# Patient Record
Sex: Female | Born: 1950 | Race: White | Hispanic: No | Marital: Married | Smoking: Never smoker
Health system: Southern US, Community
[De-identification: ages and names within clinical notes are randomized; demographics above are authoritative.]

## PROBLEM LIST (undated history)

## (undated) DIAGNOSIS — N736 Female pelvic peritoneal adhesions (postinfective): Secondary | ICD-10-CM

## (undated) DIAGNOSIS — G2581 Restless legs syndrome: Secondary | ICD-10-CM

## (undated) DIAGNOSIS — M8580 Other specified disorders of bone density and structure, unspecified site: Secondary | ICD-10-CM

## (undated) DIAGNOSIS — N951 Menopausal and female climacteric states: Secondary | ICD-10-CM

## (undated) HISTORY — DX: Other specified disorders of bone density and structure, unspecified site: M85.80

## (undated) HISTORY — DX: Restless legs syndrome: G25.81

## (undated) HISTORY — DX: Female pelvic peritoneal adhesions (postinfective): N73.6

## (undated) HISTORY — DX: Menopausal and female climacteric states: N95.1

---

## 1980-03-17 HISTORY — PX: BREAST LUMPECTOMY: SHX2

## 1985-03-17 HISTORY — PX: PELVIC LAPAROSCOPY: SHX162

## 1998-02-05 ENCOUNTER — Other Ambulatory Visit: Admission: RE | Admit: 1998-02-05 | Discharge: 1998-02-05 | Payer: Self-pay | Admitting: Obstetrics and Gynecology

## 1999-02-06 ENCOUNTER — Other Ambulatory Visit: Admission: RE | Admit: 1999-02-06 | Discharge: 1999-02-06 | Payer: Self-pay | Admitting: Obstetrics and Gynecology

## 2000-02-12 ENCOUNTER — Other Ambulatory Visit: Admission: RE | Admit: 2000-02-12 | Discharge: 2000-02-12 | Payer: Self-pay | Admitting: Obstetrics and Gynecology

## 2001-04-05 ENCOUNTER — Other Ambulatory Visit: Admission: RE | Admit: 2001-04-05 | Discharge: 2001-04-05 | Payer: Self-pay | Admitting: Obstetrics and Gynecology

## 2002-04-05 ENCOUNTER — Other Ambulatory Visit: Admission: RE | Admit: 2002-04-05 | Discharge: 2002-04-05 | Payer: Self-pay | Admitting: Obstetrics and Gynecology

## 2003-06-22 ENCOUNTER — Encounter: Admission: RE | Admit: 2003-06-22 | Discharge: 2003-06-22 | Payer: Self-pay | Admitting: Obstetrics and Gynecology

## 2003-08-23 ENCOUNTER — Other Ambulatory Visit: Admission: RE | Admit: 2003-08-23 | Discharge: 2003-08-23 | Payer: Self-pay | Admitting: Obstetrics and Gynecology

## 2003-10-25 ENCOUNTER — Ambulatory Visit (HOSPITAL_COMMUNITY): Admission: RE | Admit: 2003-10-25 | Discharge: 2003-10-25 | Payer: Self-pay | Admitting: Gastroenterology

## 2004-10-30 ENCOUNTER — Other Ambulatory Visit: Admission: RE | Admit: 2004-10-30 | Discharge: 2004-10-30 | Payer: Self-pay | Admitting: Obstetrics and Gynecology

## 2005-11-03 ENCOUNTER — Other Ambulatory Visit: Admission: RE | Admit: 2005-11-03 | Discharge: 2005-11-03 | Payer: Self-pay | Admitting: Obstetrics and Gynecology

## 2006-03-17 HISTORY — PX: OTHER SURGICAL HISTORY: SHX169

## 2007-01-26 ENCOUNTER — Other Ambulatory Visit: Admission: RE | Admit: 2007-01-26 | Discharge: 2007-01-26 | Payer: Self-pay | Admitting: Obstetrics and Gynecology

## 2008-02-03 ENCOUNTER — Other Ambulatory Visit: Admission: RE | Admit: 2008-02-03 | Discharge: 2008-02-03 | Payer: Self-pay | Admitting: Obstetrics and Gynecology

## 2008-02-03 ENCOUNTER — Encounter: Payer: Self-pay | Admitting: Obstetrics and Gynecology

## 2008-02-03 ENCOUNTER — Ambulatory Visit: Payer: Self-pay | Admitting: Obstetrics and Gynecology

## 2008-06-14 ENCOUNTER — Encounter: Admission: RE | Admit: 2008-06-14 | Discharge: 2008-06-14 | Payer: Self-pay | Admitting: Internal Medicine

## 2009-02-12 ENCOUNTER — Ambulatory Visit: Payer: Self-pay | Admitting: Obstetrics and Gynecology

## 2009-02-12 ENCOUNTER — Other Ambulatory Visit: Admission: RE | Admit: 2009-02-12 | Discharge: 2009-02-12 | Payer: Self-pay | Admitting: Obstetrics and Gynecology

## 2009-11-30 ENCOUNTER — Encounter: Admission: RE | Admit: 2009-11-30 | Discharge: 2009-11-30 | Payer: Self-pay | Admitting: Internal Medicine

## 2010-02-21 ENCOUNTER — Ambulatory Visit: Payer: Self-pay | Admitting: Obstetrics and Gynecology

## 2010-02-21 ENCOUNTER — Other Ambulatory Visit
Admission: RE | Admit: 2010-02-21 | Discharge: 2010-02-21 | Payer: Self-pay | Source: Home / Self Care | Admitting: Obstetrics and Gynecology

## 2010-04-17 DIAGNOSIS — Z1211 Encounter for screening for malignant neoplasm of colon: Secondary | ICD-10-CM

## 2010-07-19 ENCOUNTER — Other Ambulatory Visit (INDEPENDENT_AMBULATORY_CARE_PROVIDER_SITE_OTHER): Payer: BC Managed Care – PPO

## 2010-07-19 DIAGNOSIS — E7800 Pure hypercholesterolemia, unspecified: Secondary | ICD-10-CM

## 2010-08-02 NOTE — Op Note (Signed)
NAME:  [name], [name]                     ACCOUNT NO.:  [id number]   MEDICAL RECORD NO.:  [id number]                   PATIENT TYPE:  AMB   LOCATION:  ENDO                                 FACILITY:  [facility]   PHYSICIAN:  Danise Edge, M.D.                DATE OF BIRTH:  1950/05/15   DATE OF PROCEDURE:  10/25/2003  DATE OF DISCHARGE:                                 OPERATIVE REPORT   PROCEDURE:  Screening colonoscopy.   PROCEDURE INDICATION:  Ms. Victoria Ramos is a 60 year old female born  [birth year].  Ms. Victoria Ramos is scheduled to undergo her first  screening colonoscopy with polypectomy to prevent colon cancer.   ENDOSCOPIST:  Danise Edge, M.D.   PREMEDICATION:  Versed 7.5 mg, Demerol 75 mg.   PROCEDURE:  After obtaining informed consent, Ms. Victoria Ramos was placed in the  left lateral decubitus position.  I administered intravenous Demerol and  intravenous Versed to achieve conscious sedation for the procedure.  The  patient's blood pressure, oxygen saturation, and cardiac rhythm were  monitored throughout the procedure and documented in the medical record.   Anal inspection and digital rectal exam were normal.  The Olympus adjustable  pediatric colonoscope was introduced into the rectum and advanced to the  cecum.  Colonic preparation for the exam today was satisfactory.   Rectum normal.   Sigmoid colon and descending colon normal.   Splenic flexure normal.   Transverse colon normal.   Hepatic flexure normal.   Ascending colon normal.   Cecum and ileocecal valve normal.   ASSESSMENT:  Normal screening proctocolonoscopy to the cecum.  No endoscopic  evidence for the presence of colorectal neoplasia.                                               Danise Edge, M.D.    MJ/MEDQ  D:  10/25/2003  T:  10/25/2003  Job:  [id number]   cc:   Theressa Millard, M.D.  [address] [place]  [place] [address]  Fax: [phone]   Rande Brunt. Eda Paschal, M.D.  [address], [address]  [place]  [place] [address]  Fax: [phone]

## 2010-10-16 ENCOUNTER — Other Ambulatory Visit: Payer: Self-pay | Admitting: *Deleted

## 2010-10-16 MED ORDER — ERGOCALCIFEROL 1.25 MG (50000 UT) PO CAPS: ORAL_CAPSULE | ORAL | Status: DC

## 2011-03-19 DIAGNOSIS — N736 Female pelvic peritoneal adhesions (postinfective): Secondary | ICD-10-CM | POA: Insufficient documentation

## 2011-03-19 DIAGNOSIS — N951 Menopausal and female climacteric states: Secondary | ICD-10-CM | POA: Insufficient documentation

## 2011-03-24 ENCOUNTER — Encounter: Payer: BC Managed Care – PPO | Admitting: Obstetrics and Gynecology

## 2011-03-27 ENCOUNTER — Encounter: Payer: BC Managed Care – PPO | Admitting: Obstetrics and Gynecology

## 2011-04-07 ENCOUNTER — Other Ambulatory Visit (HOSPITAL_COMMUNITY)
Admission: RE | Admit: 2011-04-07 | Discharge: 2011-04-07 | Disposition: A | Discharge status: Home / Self Care | Payer: BC Managed Care – PPO | Source: Ambulatory Visit | Attending: Obstetrics and Gynecology | Admitting: Obstetrics and Gynecology

## 2011-04-07 ENCOUNTER — Encounter: Payer: Self-pay | Admitting: Obstetrics and Gynecology

## 2011-04-07 ENCOUNTER — Ambulatory Visit (INDEPENDENT_AMBULATORY_CARE_PROVIDER_SITE_OTHER): Payer: BC Managed Care – PPO | Admitting: Obstetrics and Gynecology

## 2011-04-07 VITALS — BP 122/76 | Ht 66.0 in | Wt 152.0 lb

## 2011-04-07 DIAGNOSIS — Z01419 Encounter for gynecological examination (general) (routine) without abnormal findings: Secondary | ICD-10-CM

## 2011-04-07 DIAGNOSIS — Z833 Family history of diabetes mellitus: Secondary | ICD-10-CM

## 2011-04-07 DIAGNOSIS — R635 Abnormal weight gain: Secondary | ICD-10-CM

## 2011-04-07 LAB — URINALYSIS W MICROSCOPIC + REFLEX CULTURE
Bilirubin Urine: NEGATIVE
Glucose, UA: NEGATIVE mg/dL
Ketones, ur: NEGATIVE mg/dL
Leukocytes, UA: NEGATIVE
Nitrite: NEGATIVE
Protein, ur: NEGATIVE mg/dL
Urobilinogen, UA: 0.2 mg/dL (ref 0.0–1.0)

## 2011-04-07 LAB — CBC WITH DIFFERENTIAL/PLATELET
Basophils Absolute: 0 10*3/uL (ref 0.0–0.1)
Eosinophils Relative: 2 % (ref 0–5)
Lymphocytes Relative: 45 % (ref 12–46)
MCHC: 33.1 g/dL (ref 30.0–36.0)
Monocytes Absolute: 0.3 10*3/uL (ref 0.1–1.0)
Monocytes Relative: 7 % (ref 3–12)
Neutro Abs: 1.6 10*3/uL — ABNORMAL LOW (ref 1.7–7.7)
Neutrophils Relative %: 44 % (ref 43–77)
RDW: 12.4 % (ref 11.5–15.5)
WBC: 3.6 10*3/uL — ABNORMAL LOW (ref 4.0–10.5)

## 2011-04-07 LAB — LIPID PANEL
HDL: 72 mg/dL (ref >39)
VLDL: 20 mg/dL (ref 0–40)

## 2011-04-07 MED ORDER — ESTRADIOL-LEVONORGESTREL 0.045-0.015 MG/DAY TD PTWK: 1.0000 | MEDICATED_PATCH | TRANSDERMAL | Status: DC

## 2011-04-07 NOTE — Progress Notes (Signed)
Patient came to see me today for her annual GYN exam. She is having a lot of hot flashes. She took Climara pro for 4 years. She stopped it and has been using over-the-counter medicine for approximately 2 years. She remains symptomatic. She is going to schedule her yearly mammogram. She has low bone mass without  an elevated FRAX risk. She is taking calcium and vitamin D. She's had no fractures. She is having no vaginal bleeding or pelvic pain. She has been noticing a lot of weight gain.  Physical examination:  Kennon Portela present HEENT within normal limits. Neck: Thyroid not large. No masses. Supraclavicular nodes: not enlarged. Breasts: Examined in both sitting midline position. No skin changes and no masses. Abdomen: Soft no guarding rebound or masses or hernia. Pelvic: External: Within normal limits. BUS: Within normal limits. Vaginal:within normal limits. Poor estrogen effect. No evidence of cystocele rectocele or enterocele. Cervix: clean. Uterus: Normal size and shape. Adnexa: No masses. Rectovaginal exam: Confirmatory and negative. Extremities: Within normal limits.  Assessment: #1. Menopausal symptoms.#2. Osteopenia#3. Weight gain  Plan: Climara pro. She usually cuts patch and half with good results. She will continue to do so. Mammogram. Discussed higher risk of breast.  cancer with estrogen. Discussed weight gain with menopause. TSH checked. Increase exercise and watch diet.

## 2011-04-08 LAB — HEMOGLOBIN A1C
Hgb A1c MFr Bld: 5.8 % — ABNORMAL HIGH (ref <5.7)
Mean Plasma Glucose: 120 mg/dL — ABNORMAL HIGH (ref <117)

## 2011-07-16 ENCOUNTER — Ambulatory Visit (INDEPENDENT_AMBULATORY_CARE_PROVIDER_SITE_OTHER): Payer: BC Managed Care – PPO | Admitting: Obstetrics and Gynecology

## 2011-07-16 DIAGNOSIS — N898 Other specified noninflammatory disorders of vagina: Secondary | ICD-10-CM

## 2011-07-16 LAB — WET PREP FOR TRICH, YEAST, CLUE
Clue Cells Wet Prep HPF POC: NONE SEEN
Trich, Wet Prep: NONE SEEN
Yeast Wet Prep HPF POC: NONE SEEN

## 2011-07-16 MED ORDER — METRONIDAZOLE 0.75 % VA GEL: 1.0000 | Freq: Every day | VAGINAL | Status: AC

## 2011-07-16 NOTE — Progress Notes (Signed)
Patient came to see me today with a short history of a sticky vaginal discharge without itching or odor. She had been on sulfa antibiotics 2 months ago for an upper respiratory infection.  Exam: External within normal limits. BUS:  within normal limits. Vaginal exam: Frothy discharge. Wet prep positive for WBCs and bacteria. Otherwise negative.  Assessment: Bacterial vaginosis  Plan: MetroGel vaginal cream at bedtime for 5 nights.

## 2011-09-04 ENCOUNTER — Other Ambulatory Visit: Payer: Self-pay | Admitting: Obstetrics and Gynecology

## 2011-09-11 ENCOUNTER — Encounter: Payer: Self-pay | Admitting: Obstetrics and Gynecology

## 2011-09-22 ENCOUNTER — Other Ambulatory Visit: Payer: Self-pay | Admitting: *Deleted

## 2011-09-22 DIAGNOSIS — R928 Other abnormal and inconclusive findings on diagnostic imaging of breast: Secondary | ICD-10-CM

## 2011-09-26 ENCOUNTER — Emergency Department (HOSPITAL_COMMUNITY)
Admission: EM | Admit: 2011-09-26 | Discharge: 2011-09-26 | Disposition: A | Discharge status: Home / Self Care | Payer: BC Managed Care – PPO | Attending: Emergency Medicine | Admitting: Emergency Medicine

## 2011-09-26 ENCOUNTER — Encounter: Payer: Self-pay | Admitting: Obstetrics and Gynecology

## 2011-09-26 ENCOUNTER — Encounter (HOSPITAL_COMMUNITY): Payer: Self-pay | Admitting: Emergency Medicine

## 2011-09-26 DIAGNOSIS — T6391XA Toxic effect of contact with unspecified venomous animal, accidental (unintentional), initial encounter: Secondary | ICD-10-CM | POA: Insufficient documentation

## 2011-09-26 DIAGNOSIS — T63461A Toxic effect of venom of wasps, accidental (unintentional), initial encounter: Secondary | ICD-10-CM | POA: Insufficient documentation

## 2011-09-26 DIAGNOSIS — M810 Age-related osteoporosis without current pathological fracture: Secondary | ICD-10-CM | POA: Insufficient documentation

## 2011-09-26 DIAGNOSIS — T7840XA Allergy, unspecified, initial encounter: Secondary | ICD-10-CM

## 2011-09-26 MED ORDER — DEXAMETHASONE SODIUM PHOSPHATE 10 MG/ML IJ SOLN
10.0000 mg | Freq: Once | INTRAMUSCULAR | Status: AC
  Administered 2011-09-26: 10 mg via INTRAVENOUS
  Filled 2011-09-26: qty 1

## 2011-09-26 MED ORDER — SODIUM CHLORIDE 0.9 % IV SOLN
Freq: Once | INTRAVENOUS | Status: AC
  Administered 2011-09-26: 13:00:00 via INTRAVENOUS

## 2011-09-26 MED ORDER — EPINEPHRINE 0.3 MG/0.3ML IJ DEVI: 0.3000 mg | Freq: Once | INTRAMUSCULAR | Status: AC

## 2011-09-26 MED ORDER — FAMOTIDINE IN NACL 20-0.9 MG/50ML-% IV SOLN
20.0000 mg | Freq: Once | INTRAVENOUS | Status: AC
  Administered 2011-09-26: 20 mg via INTRAVENOUS
  Filled 2011-09-26: qty 50

## 2011-09-26 MED ORDER — DIPHENHYDRAMINE HCL 50 MG/ML IJ SOLN
25.0000 mg | Freq: Once | INTRAMUSCULAR | Status: AC
  Administered 2011-09-26: 25 mg via INTRAVENOUS
  Filled 2011-09-26: qty 1

## 2011-09-26 MED ORDER — PREDNISONE 10 MG PO TABS: 20.0000 mg | ORAL_TABLET | Freq: Every day | ORAL | Status: DC

## 2011-09-26 NOTE — ED Notes (Signed)
Bee sting to RT lateral forearm about an hour ago.  Has an allergy to them but didn't use anything.  She is starting to get hives to upper arms but is breathing fine.

## 2011-09-26 NOTE — ED Provider Notes (Signed)
History     CSN: [id number]  Arrival date & time 09/26/11  1255   First MD Initiated Contact with Patient 09/26/11 1303      Chief Complaint  Patient presents with  . Allergic Reaction  . Insect Bite  . Urticaria    (Consider location/radiation/quality/duration/timing/severity/associated sxs/prior treatment) HPI Comments: Patient presents today with a rash resulting after being stung by a bee about an hour ago. She has a bee sting to her right forearm. She certainly has developed a rash that started on her arms and now is going to her back. She says it is itchy. She denies any facial swelling or difficulty swallowing. Denies any swelling of her lips or tongue. She denies any difficulty breathing but feels a little tight across her chest. She denies any other chest pain. 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 She has EpiPen at home but has never used it. She did not take any medications prior to arrival  The history is provided by the patient.    Past Medical History  Diagnosis Date  . Pelvic adhesions   . Osteoporosis   . Menopausal symptoms     Past Surgical History  Procedure Date  . Pelvic laparoscopy 1987    DIAGNOSTIC LAPAROSCOPY  . Thumb surgery 2008  . Breast lumpectomy 1982    Benign    Family History  Problem Relation Age of Onset  . Hypertension Mother   . Diabetes Maternal Aunt   . Diabetes Maternal Aunt   . Cancer Father     Prostate cancer    History  Substance Use Topics  . Smoking status: Never Smoker   . Smokeless tobacco: Not on file  . Alcohol Use: 2.5 oz/week    5 drink(s) per week    OB History    Grav Para Term Preterm Abortions TAB SAB Ect Mult Living   1 0   1     0      Review of Systems  Constitutional: Negative for fever, chills, diaphoresis and fatigue.  HENT: Negative for congestion, rhinorrhea and sneezing.   Eyes: Negative.   Respiratory: Negative for cough, chest tightness and shortness of  breath.   Cardiovascular: Negative for chest pain and leg swelling.  Gastrointestinal: Negative for nausea, vomiting, abdominal pain, diarrhea and blood in stool.  Genitourinary: Negative for frequency, hematuria, flank pain and difficulty urinating.  Musculoskeletal: Negative for back pain and arthralgias.  Skin: Positive for rash.  Neurological: Negative for dizziness, speech difficulty, weakness, numbness and headaches.    Allergies  Review of patient's allergies indicates no known allergies.  Home Medications   Current Outpatient Rx  Name Route Sig Dispense Refill  . CALCIUM-MAGNESIUM-VITAMIN D PO Oral Take by mouth.      . ERGOCALCIFEROL 50000 UNITS PO CAPS Oral Take 50,000 Units by mouth once a week. Take 1 capsule once a week for 12 weeks then 1 capsule every other week.    Marland Kitchen ESTRADIOL-LEVONORGESTREL 0.045-0.015 MG/DAY TD PTWK Transdermal Place 1 patch onto the skin once a week. 4 patch 12  . HYDROCODONE-ACETAMINOPHEN 5-500 MG PO TABS Oral Take 1-2 tablets by mouth every 6 (six) hours as needed. For pain    . MELOXICAM 15 MG PO TABS Oral Take 15 mg by mouth daily.     Marland Kitchen MELATONIN PO Oral Take by mouth.      . OXYBUTYNIN CHLORIDE ER 5 MG PO TB24 Oral Take 5 mg by mouth daily.    Marland Kitchen  VITAMIN B-6 50 MG PO TABS Oral Take 50 mg by mouth daily.    Marland Kitchen ROPINIROLE HCL 1 MG PO TABS Oral Take 1 mg by mouth 3 (three) times daily.    Marland Kitchen VALACYCLOVIR HCL 1 G PO TABS Oral Take 1-3 g by mouth daily.    Marland Kitchen VITAMIN B-12 1000 MCG PO TABS Oral Take 1,000 mcg by mouth daily.    Marland Kitchen VITAMIN E 100 UNITS PO CAPS Oral Take 100 Units by mouth daily.    Marland Kitchen EPINEPHRINE 0.3 MG/0.3ML IJ DEVI Intramuscular Inject 0.3 mLs (0.3 mg total) into the muscle once. 1 Device 0  . PREDNISONE 10 MG PO TABS Oral Take 2 tablets (20 mg total) by mouth daily. 10 tablet 0    BP 137/53  Pulse 60  Temp 98.1 F (36.7 C) (Oral)  Resp 18  SpO2 99%  Physical Exam  Constitutional: She is oriented to person, place, and time. She  appears well-developed and well-nourished.  HENT:  Head: Normocephalic and atraumatic.  Mouth/Throat: Oropharynx is clear and moist.  Eyes: Pupils are equal, round, and reactive to light.  Neck: Normal range of motion. Neck supple.  Cardiovascular: Normal rate, regular rhythm and normal heart sounds.   Pulmonary/Chest: Effort normal and breath sounds normal. No stridor. No respiratory distress. She has no wheezes. She has no rales. She exhibits no tenderness.  Abdominal: Soft. Bowel sounds are normal. There is no tenderness. There is no rebound and no guarding.  Musculoskeletal: Normal range of motion. She exhibits no edema.  Lymphadenopathy:    She has no cervical adenopathy.  Neurological: She is alert and oriented to person, place, and time.  Skin: Skin is warm and dry. Rash noted.       Raised urticarial type rash on the extremities and upper back. It is blanching. There's no petechiae no purpura no vesicular lesions  Psychiatric: She has a normal mood and affect.    ED Course  Procedures (including critical care time)  Labs Reviewed - No data to display No results found.  Date: 09/26/2011  Rate: 58  Rhythm: normal sinus rhythm  QRS Axis: normal  Intervals: normal  ST/T Wave abnormalities: normal  Conduction Disutrbances:none  Narrative Interpretation:   Old EKG Reviewed: none available   Date: 09/26/2011  Rate: 59  Rhythm: normal sinus rhythm  QRS Axis: normal  Intervals: normal  ST/T Wave abnormalities: normal  Conduction Disutrbances:none  Narrative Interpretation:   Old EKG Reviewed: unchanged    1. Allergic reaction       MDM  Pt doing much better after solumedrol, benadryl.  Has some chest tightness earlier.  EKGs okay.  No pain now.  No SOB, nausea, or diaphoresis.        Rolan Bucco, MD 09/26/11 [phone]

## 2012-04-14 ENCOUNTER — Ambulatory Visit (INDEPENDENT_AMBULATORY_CARE_PROVIDER_SITE_OTHER): Payer: BC Managed Care – PPO | Admitting: Women's Health

## 2012-04-14 ENCOUNTER — Encounter: Payer: Self-pay | Admitting: Women's Health

## 2012-04-14 VITALS — BP 120/70 | Ht 67.0 in | Wt 153.0 lb

## 2012-04-14 DIAGNOSIS — Z79890 Hormone replacement therapy (postmenopausal): Secondary | ICD-10-CM

## 2012-04-14 DIAGNOSIS — Z01419 Encounter for gynecological examination (general) (routine) without abnormal findings: Secondary | ICD-10-CM

## 2012-04-14 DIAGNOSIS — Z833 Family history of diabetes mellitus: Secondary | ICD-10-CM

## 2012-04-14 DIAGNOSIS — E079 Disorder of thyroid, unspecified: Secondary | ICD-10-CM

## 2012-04-14 DIAGNOSIS — Z13220 Encounter for screening for lipoid disorders: Secondary | ICD-10-CM

## 2012-04-14 DIAGNOSIS — N3281 Overactive bladder: Secondary | ICD-10-CM

## 2012-04-14 DIAGNOSIS — N318 Other neuromuscular dysfunction of bladder: Secondary | ICD-10-CM

## 2012-04-14 DIAGNOSIS — G2581 Restless legs syndrome: Secondary | ICD-10-CM | POA: Insufficient documentation

## 2012-04-14 DIAGNOSIS — M8580 Other specified disorders of bone density and structure, unspecified site: Secondary | ICD-10-CM | POA: Insufficient documentation

## 2012-04-14 LAB — CBC WITH DIFFERENTIAL/PLATELET
Basophils Absolute: 0 10*3/uL (ref 0.0–0.1)
Basophils Relative: 1 % (ref 0–1)
Lymphocytes Relative: 37 % (ref 12–46)
MCHC: 34.8 g/dL (ref 30.0–36.0)
Neutro Abs: 1.8 10*3/uL (ref 1.7–7.7)
Neutrophils Relative %: 50 % (ref 43–77)
Platelets: 255 10*3/uL (ref 150–400)
RDW: 13.1 % (ref 11.5–15.5)
WBC: 3.5 10*3/uL — ABNORMAL LOW (ref 4.0–10.5)

## 2012-04-14 LAB — LIPID PANEL
HDL: 62 mg/dL (ref >39)
LDL Cholesterol: 122 mg/dL — ABNORMAL HIGH (ref 0–99)
Triglycerides: 162 mg/dL — ABNORMAL HIGH (ref <150)

## 2012-04-14 LAB — HEMOGLOBIN A1C: Mean Plasma Glucose: 108 mg/dL (ref <117)

## 2012-04-14 LAB — TSH: TSH: 3.165 u[IU]/mL (ref 0.350–4.500)

## 2012-04-14 MED ORDER — ESTRADIOL-LEVONORGESTREL 0.045-0.015 MG/DAY TD PTWK: 1.0000 | MEDICATED_PATCH | TRANSDERMAL | Status: DC

## 2012-04-14 MED ORDER — OXYBUTYNIN CHLORIDE ER 5 MG PO TB24: 5.0000 mg | ORAL_TABLET | Freq: Every day | ORAL | Status: DC

## 2012-04-14 NOTE — Progress Notes (Signed)
Victoria Ramos 01/08/1951 [id number]    History:    The patient presents for annual exam.  Postmenopausal on Climara pro 0.04/0.015 states uses half patch weekly. History of normal Paps. Benign breast biopsy 1982 with normal mammograms following. Negative colonoscopy 2005/Dr. Laural Benes. Elevated lipid panel last year did not followup with primary care. Osteopenia, T score -0.4 at AP spine, left femoral neck -1.8 with no increased fracture risk,  FRAX 8.9%/1%, taking vitamin D. Overactive bladder on Ditropan 5 XL every other day with good relief of symptoms.   Past medical history, past surgical history, family history and social history were all reviewed and documented in the EPIC chart. [employer]ist information centre manager. From [place]. Husband retired with several health problems.   ROS:  A  ROS was performed and pertinent positives and negatives are included in the history.  Exam:  Filed Vitals:   04/14/12 0802  BP: 120/70    General appearance:  Normal Head/Neck:  Normal, without cervical or supraclavicular adenopathy. Thyroid:  Symmetrical, normal in size, without palpable masses or nodularity. Respiratory  Effort:  Normal  Auscultation:  Clear without wheezing or rhonchi Cardiovascular  Auscultation:  Regular rate, without rubs, murmurs or gallops  Edema/varicosities:  Not grossly evident Abdominal  Soft,nontender, without masses, guarding or rebound.  Liver/spleen:  No organomegaly noted  Hernia:  None appreciated  Skin  Inspection:  Grossly normal  Palpation:  Grossly normal Neurologic/psychiatric  Orientation:  Normal with appropriate conversation.  Mood/affect:  Normal  Genitourinary    Breasts: Examined lying and sitting.     Right: Without masses, retractions, discharge or axillary adenopathy.     Left: Without masses, retractions, discharge or axillary adenopathy.   Inguinal/mons:  Normal without inguinal adenopathy  External genitalia:  Normal  BUS/Urethra/Skene's  glands:  Normal  Bladder:  Normal  Vagina:  Normal  Cervix:  Normal  Uterus:   normal in size, shape and contour.  Midline and mobile  Adnexa/parametria:     Rt: Without masses or tenderness.   Lt: Without masses or tenderness.  Anus and perineum: Normal  Digital rectal exam: Normal sphincter tone without palpated masses or tenderness  Assessment/Plan:  62 y.o. M. WF G0 for annual exam with no complaints.     Postmenopausal on Climara pro patch with no bleeding Osteopenia Elevated lipid panel without followup Overactive bladder  Plan: SBE's, continue annual mammogram, calcium rich diet, vitamin D 2000 daily encouraged. Reviewed importance of continuing/increasing exercise for general health as well as bone health, home safety and fall prevention discussed. Repeat DEXA 2015. Climara pro patch 0.04/0.015 prescription, proper use given and reviewed has had good relief of symptoms with half patch weekly will continue. Reviewed risks of blood clots, strokes, breast cancer. Ditropan 5 mg XL every other day has had good relief of symptoms, prescription, proper use given and reviewed. CBC, lipid panel, hemoglobin A1c, UA, home Hemoccult card given with instructions. Pap normal 2013, new screening guidelines reviewed.     Harrington Challenger WHNP, 9:10 AM 04/14/2012

## 2012-04-14 NOTE — Patient Instructions (Signed)
zostavac  Shingles vaccine Health Recommendations for Postmenopausal Women Based on the Results of the Women's Health Initiative [facility]) and Other Studies The WHI is a major 15-year research program to address the most common causes of death, disability and poor quality of life in postmenopausal women. Some of these causes are heart disease, cancer, bone loss (osteoporosis) and others. Taking into account all of the findings from [facility] and other studies, here are bottom-line health recommendations for women: CARDIOVASCULAR DISEASE Heart Disease: A heart attack is a medical emergency. Know the signs and symptoms of a heart attack. Hormone therapy should not be used to prevent heart disease. In women with heart disease, hormone therapy should not be used to prevent further disease. Hormone therapy increases the risk of blood clots. Below are things women can do to reduce their risk for heart disease.   Do not smoke. If you smoke, quit. Women who smoke are 2 to 6 times more likely to suffer a heart attack than non-smoking women.  Aim for a healthy weight. Being overweight causes many preventable deaths. Eat a healthy and balanced diet and drink an adequate amount of liquids.  Get moving. Make a commitment to be more physically active. Aim for 30 minutes of activity on most, if not all days of the week.  Eat for heart health. Choose a diet that is low in saturated fat, trans fat, and cholesterol. Include whole grains, vegetables, and fruits. Read the labels on the food container before buying it.  Know your numbers. Ask your caregiver to check your blood pressure, cholesterol (total, HDL, LDL, triglycerides) and blood glucose. Work with your caregiver to improve any numbers that are not normal.  High blood pressure. Limit or stop your table salt intake (try salt substitute and food seasonings), avoid salty foods and drinks. Read the labels on the food container before buying it. Avoid becoming overweight by  eating well and exercising. STROKE  Stroke is a medical emergency. Stroke can be the result of a blood clot in the blood vessel in the brain or by a brain hemorrhage (bleeding). Know the signs and symptoms of a stroke. To lower the risk of developing a stroke:  Avoid fatty foods.  Quit smoking.  Control your diabetes, blood pressure, and irregular heart rate. THROMBOPHLIBITIS (BLOOD CLOT) OF THE LEG  Hormone treatment is a big cause of developing blood clots in the leg. Becoming overweight and leading a stationary lifestyle also may contribute to developing blood clots. Controlling your diet and exercising will help lower the risk of developing blood clots. CANCER SCREENING  Breast Cancer: Women should take steps to reduce their risk of breast cancer. This includes having regular mammograms, monthly self breast exams and regular breast exams by your caregiver. Have a mammogram every one to two years if you are 78 to 63 years old. Have a mammogram annually if you are 36 years old or older depending on your risk factors. Women who are high risk for breast cancer may need more frequent mammograms. There are tests available (testing the genes in your body) if you have family history of breast cancer called BRCA 1 and 2. These tests can help determine the risks of developing breast cancer.  Intestinal or Stomach Cancer: Women should talk to their caregiver about when to start screening, what tests and how often they should be done, and the benefits and risks of doing these tests. Tests to consider are a rectal exam, fecal occult blood, sigmoidoscopy, colononoscoby, barium enema  and upper GI series of the stomach. Depending on the age, you may want to get a medical and family history of colon cancer. Women who are high risk may need to be screened at an earlier age and more often.  Cervical Cancer: A Pap test of the cervix should be done every year and every 3 years when there has been three straight  years of a normal Pap test. Women with an abnormal Pap test should be screened more often or have a cervical biopsy depending on your caregiver's recommendation.  Uterine Cancer: If you have vaginal bleeding after you are in the menopause, it should be evaluated by your caregiver.  Ovarian cancer: There are no reliable tests available to screen for ovarian cancer at this time except for yearly pelvic exams.  Lung Cancer: Yearly chest X-rays can detect lung cancer and should be done on high risk women, such as cigarette smokers and women with chronic lung disease (emphysemia).  Skin Cancer: A complete body skin exam should be done at your yearly examination. Avoid overexposure to the sun and ultraviolet light lamps. Use a strong sun block cream when in the sun. All of these things are important in lowering the risk of skin cancer. MENOPAUSE Menopause Symptoms: Hormone therapy products are effective for treating symptoms associated with menopause:  Moderate to severe hot flashes.  Night sweats.  Mood swings.  Headaches.  Tiredness.  Loss of sex drive.  Insomnia.  Other symptoms. However, hormone therapy products carry serious risks, especially in older women. Women who use or are thinking about using estrogen or estrogen with progestin treatments should discuss that with their caregiver. Your caregiver will know if the benefits outweigh the risks. The Food and Drug Administration (FDA) has concluded that hormone therapy should be used only at the lowest doses and for the shortest amount of time to reach treatment goals. It is not known at what doses there may be less risk of serious side effects. There are other treatments such as herbal medication (not controlled or regulated by the FDA), group therapy, counseling and acupuncture that may be helpful. OSTEOPOROSIS Protecting Against Bone Loss and Preventing Fracture: If hormone therapy is used for prevention of bone loss (osteoporosis),  the risks for bone loss must outweigh the risk of the therapy. Women considering taking hormone therapy for bone loss should ask their health care providers about other medications (fosamax and boniva) that are considered safe and effective for preventing bone loss and bone fractures. To guard against bone loss or fractures, it is recommended that women should take at least 1000-1500 mg of calcium and 400-800 IU of vitamin D daily in divided doses. Smoking and excessive alcohol intake increases the risk of osteoporosis. Eat foods rich in calcium and vitamin D and do weight bearing exercises several times a week as your caregiver suggests. DIABETES Diabetes Melitus: Women with Type I or Type 2 diabetes should keep their diabetes in control with diet, exercise and medication. Avoid too many sweets, starchy and fatty foods. Being overweight can affect your diabetes. COGNITION AND MEMORY Cognition and Memory: Menopausal hormone therapy is not recommended for the prevention of cognitive disorders such as Alzheimer's disease or memory loss. WHI found that women treated with hormone therapy have a greater risk of developing dementia.  DEPRESSION  Depression may occur at any age, but is common in elderly women. The reasons may be because of physical, medical, social (loneliness), financial and/or economic problems and needs. Becoming involved with church,  volunteer or social groups, seeking treatment for any physical or medical problems is recommended. Also, look into getting professional advice for any economic or financial problems. ACCIDENTS  Accidents are common and can be serious in the elderly woman. Prepare your house to prevent accidents. Eliminate throw rugs, use hip protectors, place hand bars in the bath, shower and toilet areas. Avoid wearing high heel shoes and walking on wet, snowy and icy areas. Stop driving if you have vision, hearing problems or are unsteady with you movements and  reflexes. RHEUMATOID ARTHRITIS Rheumatoid arthritis causes pain, swelling and stiffness of your bone joints. It can limit many of your activities. Over-the-counter medications may help, but prescription medications may be necessary. Talk with your caregiver about this. Exercise (walking, water aerobics), good posture, using splints on painful joints, warm baths or applying warm compresses to stiff joints and cold compresses to painful joints may be helpful. Smoking and excessive drinking may worsen the symptoms of arthritis. Seek help from a physical therapist if the arthritis is becoming a problem with your daily activities. IMMUNIZATIONS  Several immunizations are important to have during your senior years, including:   Tetanus and a diptheria shot booster every 10 years.  Influenza every year before the flu season begins.  Pneumonia vaccine.  Shingles vaccine.  Others as indicated (example: H1N1 vaccine). Document Released: 04/25/2005 Document Revised: 05/26/2011 Document Reviewed: 12/20/2007 [facility] Patient Information 2013 [place], [place].

## 2012-04-15 LAB — URINALYSIS W MICROSCOPIC + REFLEX CULTURE
Bilirubin Urine: NEGATIVE
Casts: NONE SEEN
Crystals: NONE SEEN
Glucose, UA: NEGATIVE mg/dL
Hgb urine dipstick: NEGATIVE
Ketones, ur: NEGATIVE mg/dL
Nitrite: NEGATIVE
pH: 7 (ref 5.0–8.0)

## 2012-04-18 LAB — URINE CULTURE: Colony Count: >=100000

## 2012-04-19 ENCOUNTER — Other Ambulatory Visit: Payer: Self-pay | Admitting: Women's Health

## 2012-04-19 DIAGNOSIS — N390 Urinary tract infection, site not specified: Secondary | ICD-10-CM

## 2012-04-19 MED ORDER — SULFAMETHOXAZOLE-TRIMETHOPRIM 800-160 MG PO TABS: 1.0000 | ORAL_TABLET | Freq: Two times a day (BID) | ORAL | Status: DC

## 2012-05-01 ENCOUNTER — Other Ambulatory Visit: Payer: Self-pay

## 2012-05-07 IMAGING — US US SOFT TISSUE HEAD/NECK
1 series · 14 of 25 positions shown · non-contrast
Comparison: None.

CLINICAL DATA: Enlarged thyroid on physical exam.

THYROID ULTRASOUND
TECHNIQUE: Ultrasound examination of the thyroid gland and adjacent
soft tissues was performed.

[Series 1: us soft tissue head/neck · 0.12mm/px · 14 of 43 slices shown]
[im 1/43]
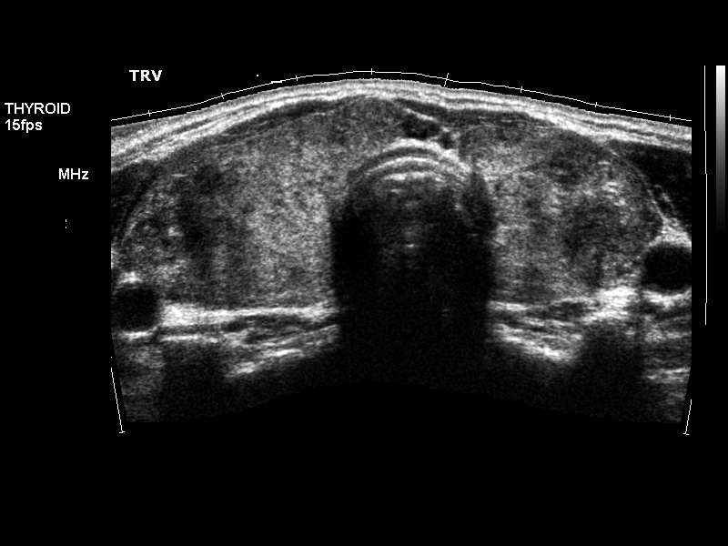
[im 4/43]
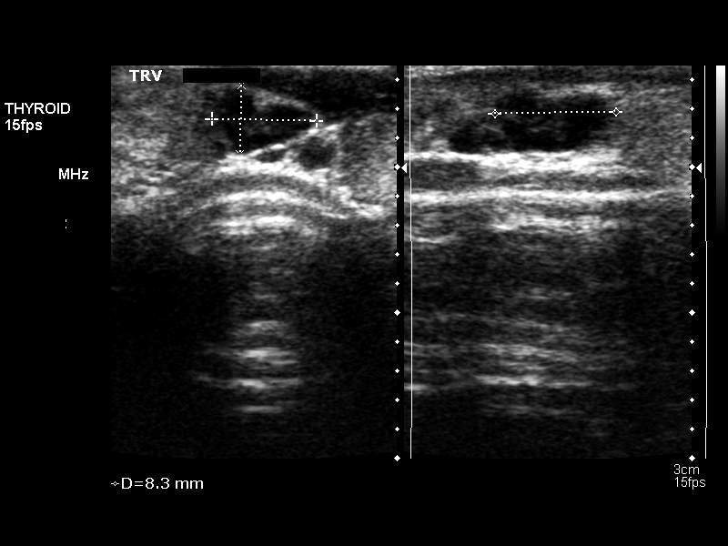
[im 8/43]
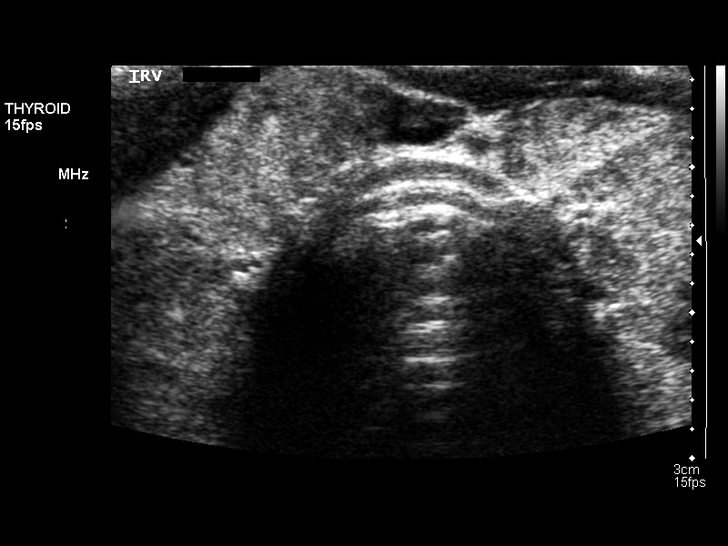
[im 11/43]
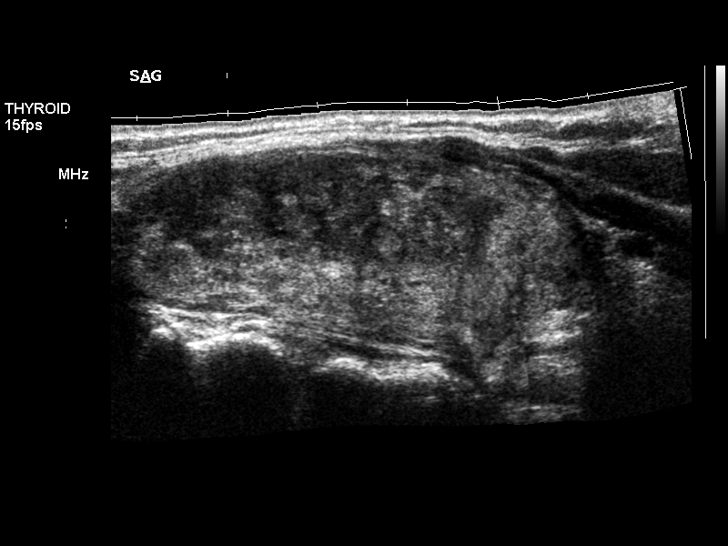
[im 15/43]
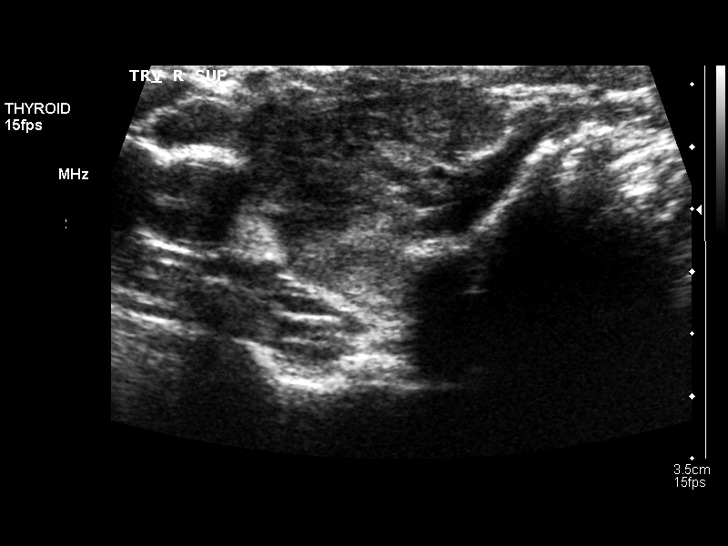
[im 16/43]
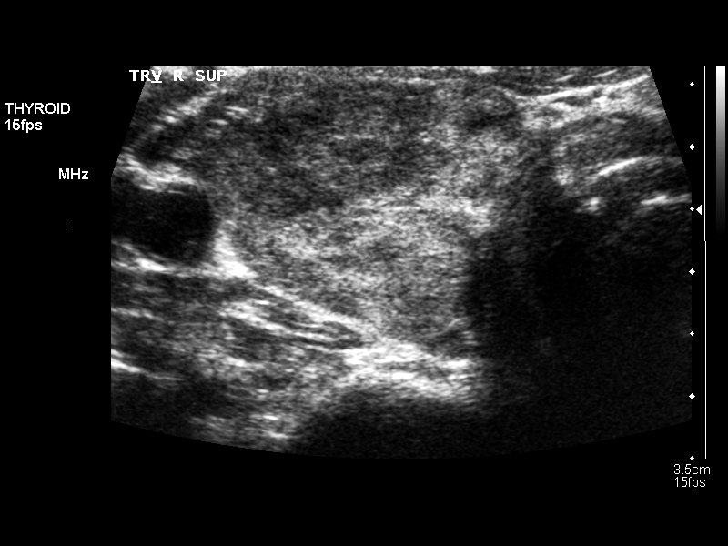
[im 20/43]
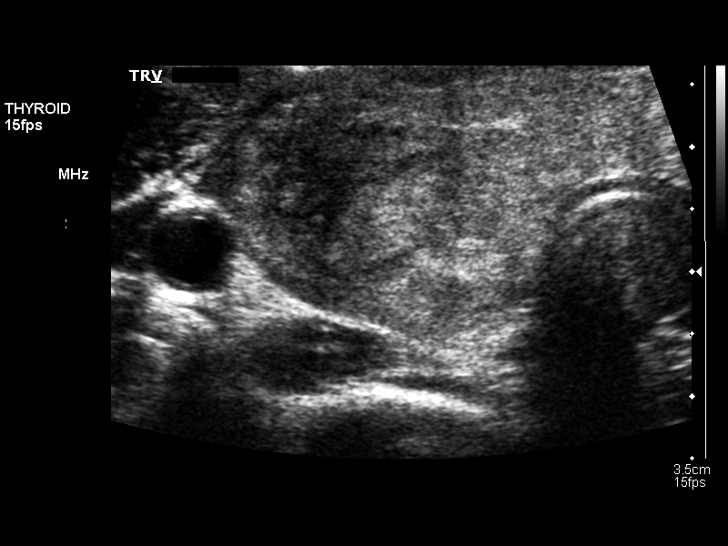
[im 23/43]
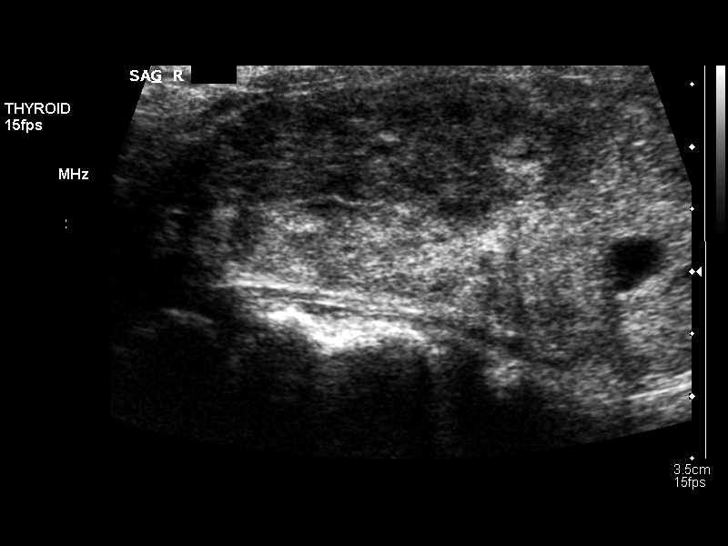
[im 27/43]
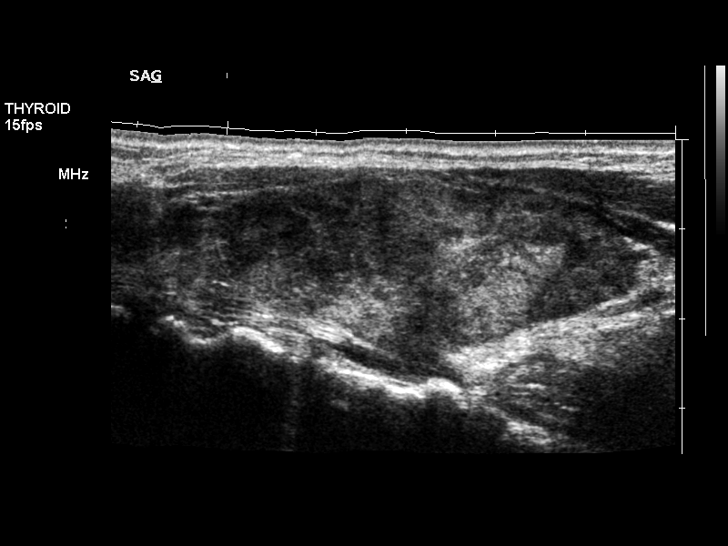
[im 29/43]
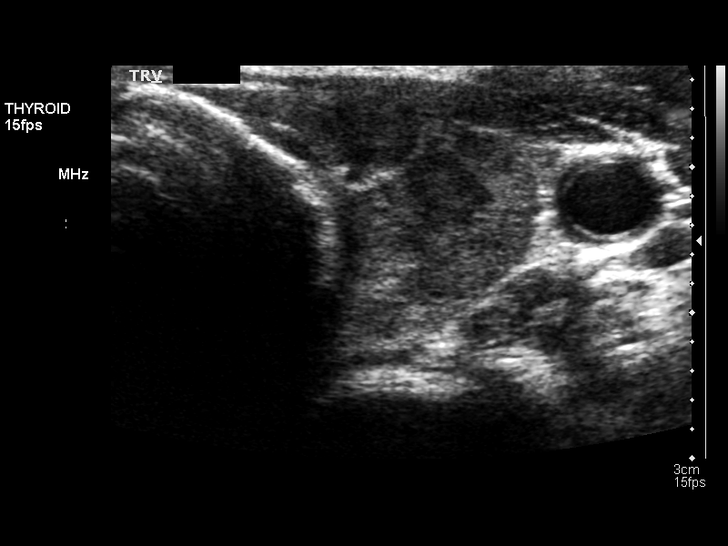
[im 32/43]
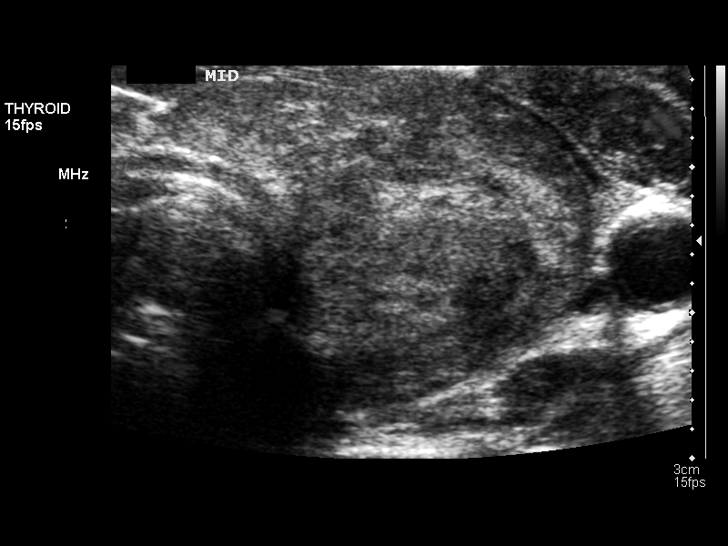
[im 36/43]
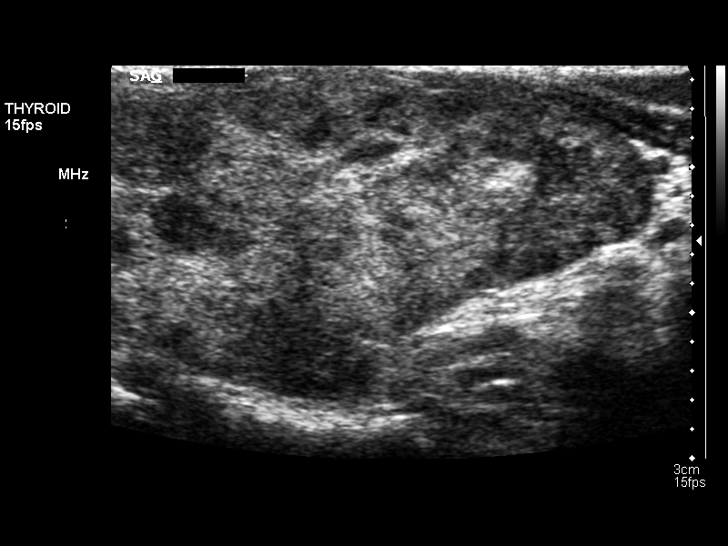
[im 39/43]
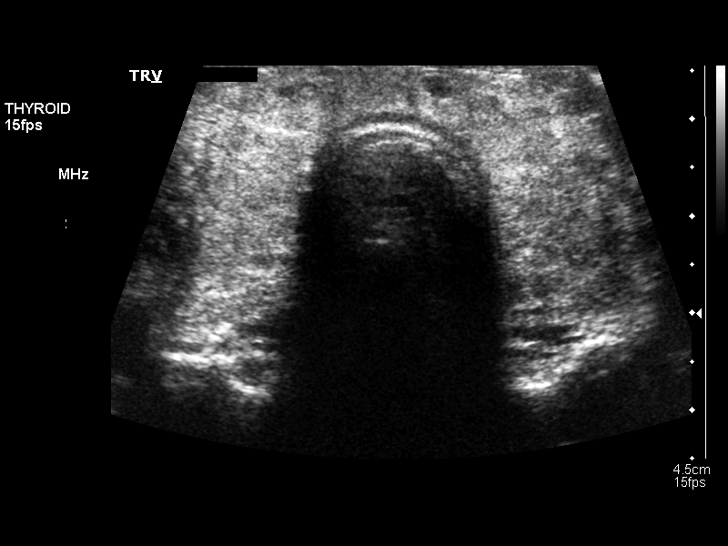
[im 43/43]
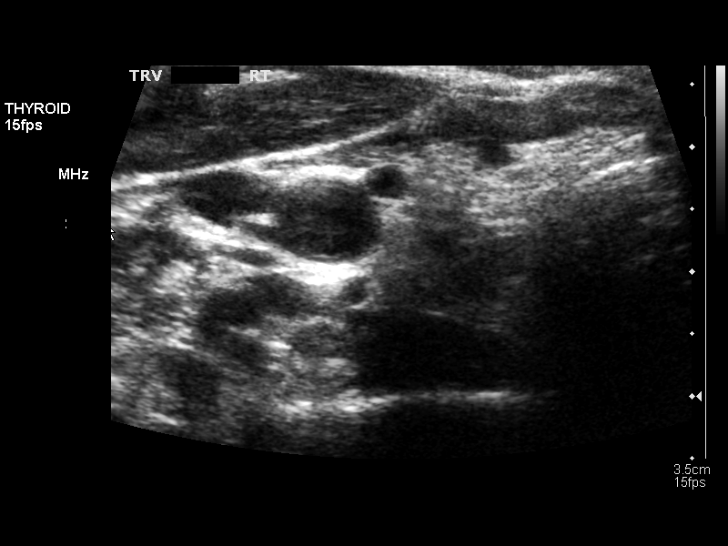

[14 of 25 positions shown; findings below may reference images not displayed]

FINDINGS: Right thyroid lobe:  5.7 cm long X 2.1 cm AP X 3.3 cm wide.
Left thyroid lobe:  5.6 cm long X 2.1 cm AP X 2.1 cm wide.
Isthmus:  Thickened at 8 mm AP.

Focal nodules:  Solitary marginated complex cystic nodule at the
isthmus measures 7 mm long X 5 mm AP X 8 mm wide.  Remaining
thyroid gland demonstrates heterogeneous echotexture with no focal
lesions.

Lymphadenopathy:  None visualized.
IMPRESSION: 1.  Heterogeneous slightly enlarged thyroid especially isthmus with
solitary 8 mm isthmus complex cyst consistent with slight
multinodular goiter and/or chronic Hashimoto's thyroiditis.
2.  Otherwise, negative.

## 2012-05-17 ENCOUNTER — Other Ambulatory Visit: Payer: BC Managed Care – PPO

## 2012-05-18 LAB — URINALYSIS W MICROSCOPIC + REFLEX CULTURE
Bilirubin Urine: NEGATIVE
Glucose, UA: NEGATIVE mg/dL
Hgb urine dipstick: NEGATIVE
Protein, ur: NEGATIVE mg/dL
Squamous Epithelial / LPF: NONE SEEN
Urobilinogen, UA: 0.2 mg/dL (ref 0.0–1.0)

## 2012-05-19 ENCOUNTER — Other Ambulatory Visit: Payer: Self-pay | Admitting: Anesthesiology

## 2012-05-19 ENCOUNTER — Other Ambulatory Visit: Payer: Self-pay | Admitting: Gynecology

## 2012-05-19 DIAGNOSIS — Z1211 Encounter for screening for malignant neoplasm of colon: Secondary | ICD-10-CM

## 2012-05-24 ENCOUNTER — Other Ambulatory Visit: Payer: Self-pay | Admitting: Obstetrics and Gynecology

## 2012-09-23 ENCOUNTER — Encounter: Payer: Self-pay | Admitting: Women's Health

## 2013-01-14 ENCOUNTER — Other Ambulatory Visit: Payer: Self-pay | Admitting: Internal Medicine

## 2013-01-14 DIAGNOSIS — R42 Dizziness and giddiness: Secondary | ICD-10-CM

## 2013-01-14 DIAGNOSIS — R0989 Other specified symptoms and signs involving the circulatory and respiratory systems: Secondary | ICD-10-CM

## 2013-01-14 DIAGNOSIS — R200 Anesthesia of skin: Secondary | ICD-10-CM

## 2013-01-17 ENCOUNTER — Ambulatory Visit
Admission: RE | Admit: 2013-01-17 | Discharge: 2013-01-17 | Disposition: A | Discharge status: Home / Self Care | Payer: BC Managed Care – PPO | Source: Ambulatory Visit | Attending: Internal Medicine | Admitting: Internal Medicine

## 2013-01-17 DIAGNOSIS — R42 Dizziness and giddiness: Secondary | ICD-10-CM

## 2013-01-17 DIAGNOSIS — R0989 Other specified symptoms and signs involving the circulatory and respiratory systems: Secondary | ICD-10-CM

## 2013-01-17 DIAGNOSIS — R200 Anesthesia of skin: Secondary | ICD-10-CM

## 2013-01-20 ENCOUNTER — Other Ambulatory Visit: Payer: Self-pay

## 2013-04-15 ENCOUNTER — Ambulatory Visit (INDEPENDENT_AMBULATORY_CARE_PROVIDER_SITE_OTHER): Payer: BC Managed Care – PPO | Admitting: Women's Health

## 2013-04-15 ENCOUNTER — Encounter: Payer: Self-pay | Admitting: Women's Health

## 2013-04-15 ENCOUNTER — Other Ambulatory Visit (HOSPITAL_COMMUNITY)
Admission: RE | Admit: 2013-04-15 | Discharge: 2013-04-15 | Disposition: A | Discharge status: Home / Self Care | Payer: BC Managed Care – PPO | Source: Ambulatory Visit | Attending: Gynecology | Admitting: Gynecology

## 2013-04-15 VITALS — BP 118/74 | Ht 66.12 in | Wt 149.8 lb

## 2013-04-15 DIAGNOSIS — N3281 Overactive bladder: Secondary | ICD-10-CM

## 2013-04-15 DIAGNOSIS — Z01419 Encounter for gynecological examination (general) (routine) without abnormal findings: Secondary | ICD-10-CM | POA: Insufficient documentation

## 2013-04-15 DIAGNOSIS — N318 Other neuromuscular dysfunction of bladder: Secondary | ICD-10-CM

## 2013-04-15 DIAGNOSIS — Z833 Family history of diabetes mellitus: Secondary | ICD-10-CM

## 2013-04-15 DIAGNOSIS — Z79890 Hormone replacement therapy (postmenopausal): Secondary | ICD-10-CM

## 2013-04-15 DIAGNOSIS — Z13220 Encounter for screening for lipoid disorders: Secondary | ICD-10-CM

## 2013-04-15 LAB — COMPREHENSIVE METABOLIC PANEL
ALK PHOS: 67 U/L (ref 39–117)
ALT: 27 U/L (ref 0–35)
AST: 33 U/L (ref 0–37)
Albumin: 4.1 g/dL (ref 3.5–5.2)
BUN: 15 mg/dL (ref 6–23)
CO2: 32 mEq/L (ref 19–32)
CREATININE: 0.79 mg/dL (ref 0.50–1.10)
Calcium: 8.8 mg/dL (ref 8.4–10.5)
Chloride: 101 mEq/L (ref 96–112)
Glucose, Bld: 89 mg/dL (ref 70–99)
Potassium: 3.9 mEq/L (ref 3.5–5.3)
Sodium: 138 mEq/L (ref 135–145)
Total Bilirubin: 0.5 mg/dL (ref 0.2–1.2)
Total Protein: 7.1 g/dL (ref 6.0–8.3)

## 2013-04-15 LAB — CBC WITH DIFFERENTIAL/PLATELET
BASOS ABS: 0 10*3/uL (ref 0.0–0.1)
BASOS PCT: 1 % (ref 0–1)
EOS ABS: 0.1 10*3/uL (ref 0.0–0.7)
EOS PCT: 1 % (ref 0–5)
HEMATOCRIT: 37.3 % (ref 36.0–46.0)
Hemoglobin: 12.6 g/dL (ref 12.0–15.0)
Lymphocytes Relative: 39 % (ref 12–46)
Lymphs Abs: 1.6 10*3/uL (ref 0.7–4.0)
MCH: 29.8 pg (ref 26.0–34.0)
MCHC: 33.8 g/dL (ref 30.0–36.0)
MCV: 88.2 fL (ref 78.0–100.0)
MONO ABS: 0.4 10*3/uL (ref 0.1–1.0)
Monocytes Relative: 10 % (ref 3–12)
Neutro Abs: 2.1 10*3/uL (ref 1.7–7.7)
Neutrophils Relative %: 49 % (ref 43–77)
Platelets: 227 10*3/uL (ref 150–400)
RBC: 4.23 MIL/uL (ref 3.87–5.11)
RDW: 13.1 % (ref 11.5–15.5)
WBC: 4.2 10*3/uL (ref 4.0–10.5)

## 2013-04-15 LAB — LIPID PANEL
CHOL/HDL RATIO: 3.5 ratio
Cholesterol: 211 mg/dL — ABNORMAL HIGH (ref 0–200)
HDL: 60 mg/dL (ref >39)
LDL CALC: 126 mg/dL — AB (ref 0–99)
TRIGLYCERIDES: 123 mg/dL (ref <150)
VLDL: 25 mg/dL (ref 0–40)

## 2013-04-15 MED ORDER — OXYBUTYNIN CHLORIDE ER 5 MG PO TB24: 5.0000 mg | ORAL_TABLET | Freq: Every day | ORAL | Status: AC

## 2013-04-15 MED ORDER — ESTRADIOL-LEVONORGESTREL 0.045-0.015 MG/DAY TD PTWK: MEDICATED_PATCH | TRANSDERMAL | Status: AC

## 2013-04-15 NOTE — Progress Notes (Signed)
Gaspar GarbeRosemary E Belitz 1950-12-29 [id number]    History:    Presents for annual exam.  Postmenopausal/no bleeding on Climara pro patch uses 1/2  patch weekly for past several years with good relief of symptoms. Normal Pap and mammogram history, benign breast biopsy 1982. 2 negative colonoscopies. 03/2010 osteopenia T score -1.8 at femoral neck FRAX 8.9%/1%. Ditropan XL every other day for overactive bladder.  Past medical history, past surgical history, family history and social history were all reviewed and documented in the EPIC chart. 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 Husband poor health with strokes, history of brain tumor. Currently going to an adult daycare. Elevated cholesterol in the past, no followup with primary care.  ROS:  A  ROS was performed and pertinent positives and negatives are included.  Exam:  Filed Vitals:   04/15/13 0809  BP: 118/74    General appearance:  Normal Thyroid:  Symmetrical, normal in size, without palpable masses or nodularity. Respiratory  Auscultation:  Clear without wheezing or rhonchi Cardiovascular  Auscultation:  Regular rate, without rubs, murmurs or gallops  Edema/varicosities:  Not grossly evident Abdominal  Soft,nontender, without masses, guarding or rebound.  Liver/spleen:  No organomegaly noted  Hernia:  None appreciated  Skin  Inspection:  Grossly normal   Breasts: Examined lying and sitting.     Right: Without masses, retractions, discharge or axillary adenopathy.     Left: Without masses, retractions, discharge or axillary adenopathy. Gentitourinary   Inguinal/mons:  Normal without inguinal adenopathy  External genitalia:  Normal  BUS/Urethra/Skene's glands:  Normal  Vagina:  Normal  Cervix:  Normal  Uterus:  normal in size, shape and contour.  Midline and mobile  Adnexa/parametria:     Rt: Without masses or tenderness.   Lt: Without masses or tenderness.  Anus and perineum: Normal  Digital rectal exam: Normal sphincter tone without  palpated masses or tenderness  Assessment/Plan:  63 y.o. MWF G0 for annual exam.   Postmenopausal/no bleeding Climara pro patch Elevated cholesterol Osteopenia Overactive bladder on Ditropan  Plan: HRT, Climara pro patch weekly, prescription, proper use, risks of blood clots, strokes, breast cancer reviewed. States has increased hot flushes and very poor sleep when off, has used a half patch weekly for several years, reviewed it is off label.  Ditropan 5 mg XL uses every other day with good relief of symptoms. SBE's, continue annual mammogram 3-D tomography reviewed, calcium rich diet, vitamin D 2000 daily, regular exercise, importance of self-care especially since caring for ill husband. CBC, comprehensive metabolic, lipid panel, UA, Pap. Pap normal 03/2011, new screening guidelines reviewed.   Harrington ChallengerYOUNG,NANCY J [facility]'S Health CenterWHNP, 8:47 AM 04/15/2013

## 2013-04-15 NOTE — Patient Instructions (Signed)
Health Recommendations for Postmenopausal Women Respected and ongoing research has looked at the most common causes of death, disability, and poor quality of life in postmenopausal women. The causes include heart disease, diseases of blood vessels, diabetes, depression, cancer, and bone loss (osteoporosis). Many things can be done to help lower the chances of developing these and other common problems: CARDIOVASCULAR DISEASE Heart Disease: A heart attack is a medical emergency. Know the signs and symptoms of a heart attack. Below are things women can do to reduce their risk for heart disease.   Do not smoke. If you smoke, quit.  Aim for a healthy weight. Being overweight causes many preventable deaths. Eat a healthy and balanced diet and drink an adequate amount of liquids.  Get moving. Make a commitment to be more physically active. Aim for 30 minutes of activity on most, if not all days of the week.  Eat for heart health. Choose a diet that is low in saturated fat and cholesterol and eliminate trans fat. Include whole grains, vegetables, and fruits. Read and understand the labels on food containers before buying.  Know your numbers. Ask your caregiver to check your blood pressure, cholesterol (total, HDL, LDL, triglycerides) and blood glucose. Work with your caregiver on improving your entire clinical picture.  High blood pressure. Limit or stop your table salt intake (try salt substitute and food seasonings). Avoid salty foods and drinks. Read labels on food containers before buying. Eating well and exercising can help control high blood pressure. STROKE  Stroke is a medical emergency. Stroke may be the result of a blood clot in a blood vessel in the brain or by a brain hemorrhage (bleeding). Know the signs and symptoms of a stroke. To lower the risk of developing a stroke:  Avoid fatty foods.  Quit smoking.  Control your diabetes, blood pressure, and irregular heart rate. THROMBOPHLEBITIS  (BLOOD CLOT) OF THE LEG  Becoming overweight and leading a stationary lifestyle may also contribute to developing blood clots. Controlling your diet and exercising will help lower the risk of developing blood clots. CANCER SCREENING  Breast Cancer: Take steps to reduce your risk of breast cancer.  You should practice "breast self-awareness." This means understanding the normal appearance and feel of your breasts and should include breast self-examination. Any changes detected, no matter how small, should be reported to your caregiver.  After age 40, you should have a clinical breast exam (CBE) every year.  Starting at age 40, you should consider having a mammogram (breast X-ray) every year.  If you have a family history of breast cancer, talk to your caregiver about genetic screening.  If you are at high risk for breast cancer, talk to your caregiver about having an MRI and a mammogram every year.  Intestinal or Stomach Cancer: Tests to consider are a rectal exam, fecal occult blood, sigmoidoscopy, and colonoscopy. Women who are high risk may need to be screened at an earlier age and more often.  Cervical Cancer:  Beginning at age 30, you should have a Pap test every 3 years as long as the past 3 Pap tests have been normal.  If you have had past treatment for cervical cancer or a condition that could lead to cancer, you need Pap tests and screening for cancer for at least 20 years after your treatment.  If you had a hysterectomy for a problem that was not cancer or a condition that could lead to cancer, then you no longer need Pap tests.    If you are between ages 65 and 70, and you have had normal Pap tests going back 10 years, you no longer need Pap tests.  If Pap tests have been discontinued, risk factors (such as a new sexual partner) need to be reassessed to determine if screening should be resumed.  Some medical problems can increase the chance of getting cervical cancer. In these  cases, your caregiver may recommend more frequent screening and Pap tests.  Uterine Cancer: If you have vaginal bleeding after reaching menopause, you should notify your caregiver.  Ovarian cancer: Other than yearly pelvic exams, there are no reliable tests available to screen for ovarian cancer at this time except for yearly pelvic exams.  Lung Cancer: Yearly chest X-rays can detect lung cancer and should be done on high risk women, such as cigarette smokers and women with chronic lung disease (emphysema).  Skin Cancer: A complete body skin exam should be done at your yearly examination. Avoid overexposure to the sun and ultraviolet light lamps. Use a strong sun block cream when in the sun. All of these things are important in lowering the risk of skin cancer. MENOPAUSE Menopause Symptoms: Hormone therapy products are effective for treating symptoms associated with menopause:  Moderate to severe hot flashes.  Night sweats.  Mood swings.  Headaches.  Tiredness.  Loss of sex drive.  Insomnia.  Other symptoms. Hormone replacement carries certain risks, especially in older women. Women who use or are thinking about using estrogen or estrogen with progestin treatments should discuss that with their caregiver. Your caregiver will help you understand the benefits and risks. The ideal dose of hormone replacement therapy is not known. The Food and Drug Administration (FDA) has concluded that hormone therapy should be used only at the lowest doses and for the shortest amount of time to reach treatment goals.  OSTEOPOROSIS Protecting Against Bone Loss and Preventing Fracture: If you use hormone therapy for prevention of bone loss (osteoporosis), the risks for bone loss must outweigh the risk of the therapy. Ask your caregiver about other medications known to be safe and effective for preventing bone loss and fractures. To guard against bone loss or fractures, the following is recommended:  If  you are less than age 50, take 1000 mg of calcium and at least 600 mg of Vitamin D per day.  If you are greater than age 50 but less than age 70, take 1200 mg of calcium and at least 600 mg of Vitamin D per day.  If you are greater than age 70, take 1200 mg of calcium and at least 800 mg of Vitamin D per day. Smoking and excessive alcohol intake increases the risk of osteoporosis. Eat foods rich in calcium and vitamin D and do weight bearing exercises several times a week as your caregiver suggests. DIABETES Diabetes Melitus: If you have Type I or Type 2 diabetes, you should keep your blood sugar under control with diet, exercise and recommended medication. Avoid too many sweets, starchy and fatty foods. Being overweight can make control more difficult. COGNITION AND MEMORY Cognition and Memory: Menopausal hormone therapy is not recommended for the prevention of cognitive disorders such as Alzheimer's disease or memory loss.  DEPRESSION  Depression may occur at any age, but is common in elderly women. The reasons may be because of physical, medical, social (loneliness), or financial problems and needs. If you are experiencing depression because of medical problems and control of symptoms, talk to your caregiver about this. Physical activity and   exercise may help with mood and sleep. Community and volunteer involvement may help your sense of value and worth. If you have depression and you feel that the problem is getting worse or becoming severe, talk to your caregiver about treatment options that are best for you. ACCIDENTS  Accidents are common and can be serious in the elderly woman. Prepare your house to prevent accidents. Eliminate throw rugs, place hand bars in the bath, shower and toilet areas. Avoid wearing high heeled shoes or walking on wet, snowy, and icy areas. Limit or stop driving if you have vision or hearing problems, or you feel you are unsteady with you movements and  reflexes. HEPATITIS C Hepatitis C is a type of viral infection affecting the liver. It is spread mainly through contact with blood from an infected person. It can be treated, but if left untreated, it can lead to severe liver damage over years. Many people who are infected do not know that the virus is in their blood. If you are a "baby-boomer", it is recommended that you have one screening test for Hepatitis C. IMMUNIZATIONS  Several immunizations are important to consider having during your senior years, including:   Tetanus, diptheria, and pertussis booster shot.  Influenza every year before the flu season begins.  Pneumonia vaccine.  Shingles vaccine.  Others as indicated based on your specific needs. Talk to your caregiver about these. Document Released: 04/25/2005 Document Revised: 02/18/2012 Document Reviewed: 12/20/2007 [employer] Patient Information 2014 [employer].  

## 2013-04-15 NOTE — Addendum Note (Signed)
Addended by: Richardson ChiquitoWILKINSON, KARI S on: 04/15/2013 11:41 AM   Modules accepted: Orders

## 2013-04-16 LAB — URINALYSIS W MICROSCOPIC + REFLEX CULTURE
BACTERIA UA: NONE SEEN
Bilirubin Urine: NEGATIVE
Casts: NONE SEEN
Crystals: NONE SEEN
GLUCOSE, UA: NEGATIVE mg/dL
HGB URINE DIPSTICK: NEGATIVE
KETONES UR: NEGATIVE mg/dL
LEUKOCYTES UA: NEGATIVE
NITRITE: NEGATIVE
PROTEIN: NEGATIVE mg/dL
Specific Gravity, Urine: 1.026 (ref 1.005–1.030)
Urobilinogen, UA: 0.2 mg/dL (ref 0.0–1.0)
pH: 7.5 (ref 5.0–8.0)

## 2013-05-05 ENCOUNTER — Encounter: Payer: Self-pay | Admitting: Women's Health

## 2013-05-08 ENCOUNTER — Encounter: Payer: Self-pay | Admitting: Women's Health

## 2013-05-10 ENCOUNTER — Telehealth: Payer: Self-pay

## 2013-05-10 NOTE — Telephone Encounter (Signed)
I received an "unread message alert" for this patient. Victoria Ramos had sent her a message. I called her and told her and that I would be happy to read the message to her.  I read her "Coy SaunasRosemary, your dexa is stable or even a little better. Spine is strong, weakness in the hips - osteopenia. The estrogen in the patch is helpful for your bones. 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 Brisk daily walk would also be good, I know you are busy, especially with your husband. Let me know if any questions. Continue the vit d 2000 and calcium rich diet. Victoria Ramos ".  She did not have any questions. She did state she was having trouble getting on My Chart and asked if I knew her password. I told her I would send her a new access code and there would be a phone number on that sheet to call if she has any trouble getting access to My Chart.  Access code mailed to her.

## 2014-01-16 ENCOUNTER — Encounter: Payer: Self-pay | Admitting: Women's Health
